# Patient Record
Sex: Female | Born: 1980 | Race: Black or African American | Hispanic: No | Marital: Single | Smoking: Never smoker
Health system: Southern US, Community
[De-identification: ages and names within clinical notes are randomized; demographics above are authoritative.]

## PROBLEM LIST (undated history)

## (undated) DIAGNOSIS — I10 Essential (primary) hypertension: Secondary | ICD-10-CM

## (undated) DIAGNOSIS — E119 Type 2 diabetes mellitus without complications: Secondary | ICD-10-CM

## (undated) HISTORY — PX: BREAST SURGERY: SHX581

---

## 2017-11-28 ENCOUNTER — Emergency Department (HOSPITAL_COMMUNITY): Payer: Medicaid Other

## 2017-11-28 ENCOUNTER — Emergency Department (HOSPITAL_BASED_OUTPATIENT_CLINIC_OR_DEPARTMENT_OTHER): Payer: Medicaid Other

## 2017-11-28 ENCOUNTER — Emergency Department (HOSPITAL_BASED_OUTPATIENT_CLINIC_OR_DEPARTMENT_OTHER)
Admission: EM | Admit: 2017-11-28 | Discharge: 2017-11-29 | Disposition: A | Discharge status: Home / Self Care | Payer: Medicaid Other | Attending: Emergency Medicine | Admitting: Emergency Medicine

## 2017-11-28 ENCOUNTER — Other Ambulatory Visit: Payer: Self-pay

## 2017-11-28 ENCOUNTER — Encounter (HOSPITAL_BASED_OUTPATIENT_CLINIC_OR_DEPARTMENT_OTHER): Payer: Self-pay | Admitting: Emergency Medicine

## 2017-11-28 DIAGNOSIS — Z794 Long term (current) use of insulin: Secondary | ICD-10-CM | POA: Insufficient documentation

## 2017-11-28 DIAGNOSIS — O418X10 Other specified disorders of amniotic fluid and membranes, first trimester, not applicable or unspecified: Secondary | ICD-10-CM

## 2017-11-28 DIAGNOSIS — W19XXXA Unspecified fall, initial encounter: Secondary | ICD-10-CM

## 2017-11-28 DIAGNOSIS — Z3A11 11 weeks gestation of pregnancy: Secondary | ICD-10-CM | POA: Insufficient documentation

## 2017-11-28 DIAGNOSIS — Z79899 Other long term (current) drug therapy: Secondary | ICD-10-CM | POA: Insufficient documentation

## 2017-11-28 DIAGNOSIS — M79672 Pain in left foot: Secondary | ICD-10-CM

## 2017-11-28 DIAGNOSIS — O468X1 Other antepartum hemorrhage, first trimester: Secondary | ICD-10-CM | POA: Insufficient documentation

## 2017-11-28 DIAGNOSIS — IMO0001 Reserved for inherently not codable concepts without codable children: Secondary | ICD-10-CM

## 2017-11-28 HISTORY — DX: Essential (primary) hypertension: I10

## 2017-11-28 HISTORY — DX: Type 2 diabetes mellitus without complications: E11.9

## 2017-11-28 MED ORDER — ACETAMINOPHEN 325 MG PO TABS
650.0000 mg | ORAL_TABLET | Freq: Once | ORAL | Status: AC
  Administered 2017-11-28: 650 mg via ORAL
  Filled 2017-11-28: qty 2

## 2017-11-28 NOTE — ED Notes (Signed)
Patient transported to Ultrasound 

## 2017-11-28 NOTE — ED Triage Notes (Signed)
Pt states she slipped and fell in the kitchen this evening. Denies LOC. Pt c/o low abd pain, back pain and L foot pain. Pt is [redacted] weeks pregnant.

## 2017-11-29 ENCOUNTER — Encounter (HOSPITAL_BASED_OUTPATIENT_CLINIC_OR_DEPARTMENT_OTHER): Payer: Self-pay | Admitting: Emergency Medicine

## 2017-11-29 LAB — URINALYSIS, ROUTINE W REFLEX MICROSCOPIC
Bilirubin Urine: NEGATIVE
GLUCOSE, UA: 100 mg/dL — AB
KETONES UR: NEGATIVE mg/dL
LEUKOCYTES UA: NEGATIVE
NITRITE: NEGATIVE
PROTEIN: 100 mg/dL — AB
Specific Gravity, Urine: 1.015 (ref 1.005–1.030)
pH: 7 (ref 5.0–8.0)

## 2017-11-29 LAB — URINALYSIS, MICROSCOPIC (REFLEX)

## 2017-11-29 NOTE — ED Provider Notes (Signed)
[facility] EMERGENCY DEPARTMENT Provider Note   CSN: [id number] Arrival date & time: 11/28/17  2020     History   Chief Complaint Chief Complaint  Patient presents with  . Fall    HPI Erica Benton is a 37 y.o. female.  The history is provided by the patient.  Fall  This is a new problem. The current episode started 6 to 12 hours ago. The problem occurs constantly. The problem has not changed since onset.Pertinent negatives include no chest pain, no abdominal pain, no headaches and no shortness of breath. The symptoms are aggravated by walking. Nothing relieves the symptoms. She has tried nothing for the symptoms. The treatment provided no relief.  Landed on her bottom and L foot got caught.  No bleeding of leakage of fluid.  Is approximately [redacted] weeks pregnant, RH positive.    Past Medical History:  Diagnosis Date  . Diabetes mellitus without complication (HCC)   . Hypertension     There are no active problems to display for this patient.   Past Surgical History:  Procedure Laterality Date  . BREAST SURGERY    . CESAREAN SECTION      OB History    Gravida Para Term Preterm AB Living   1             SAB TAB Ectopic Multiple Live Births                   Home Medications    Prior to Admission medications   Medication Sig Start Date End Date Taking? Authorizing Provider  diltiazem (CARDIZEM) 100 MG injection Inject 120 mg into the vein daily.   Yes [provider]  insulin aspart (NOVOLOG) 100 UNIT/ML injection Inject into the skin 3 (three) times daily before meals.   Yes [provider]  metFORMIN (GLUCOPHAGE) 500 MG tablet Take by mouth 2 (two) times daily with a meal.   Yes [provider]    Family History No family history on file.  Social History Social History   Tobacco Use  . Smoking status: Never Smoker  . Smokeless tobacco: Never Used  Substance Use Topics  . Alcohol use: Not on file  . Drug use: Not on  file     Allergies   Ace inhibitors and Sulfa antibiotics   Review of Systems Review of Systems  Respiratory: Negative for shortness of breath.   Cardiovascular: Negative for chest pain.  Gastrointestinal: Negative for abdominal pain.  Neurological: Negative for headaches.  All other systems reviewed and are negative.    Physical Exam Updated Vital Signs BP 128/70 (BP Location: Right Arm)   Pulse 81   Temp 99 F (37.2 C) (Oral)   Resp 18   Ht 5\' 9"  (1.753 m)   Wt 117.9 kg (260 lb)   SpO2 100%   BMI 38.40 kg/m   Physical Exam  Constitutional: She is oriented to person, place, and time. She appears well-developed and well-nourished. No distress.  HENT:  Head: Normocephalic and atraumatic.  Right Ear: External ear normal.  Left Ear: External ear normal.  Nose: Nose normal.  Mouth/Throat: Oropharynx is clear and moist. No oropharyngeal exudate.  Eyes: Conjunctivae and EOM are normal. Pupils are equal, round, and reactive to light.  Neck: Normal range of motion. Neck supple.  Cardiovascular: Normal rate, regular rhythm, normal heart sounds and intact distal pulses.  Pulmonary/Chest: Effort normal and breath sounds normal. No stridor. She has no wheezes. She has no  rales.  Abdominal: Soft. Bowel sounds are normal. She exhibits no distension and no mass. There is no rebound and no guarding.  Musculoskeletal: Normal range of motion. She exhibits no tenderness or deformity.       Right hip: Normal.       Left hip: Normal.       Left knee: Normal.       Left ankle: Normal. Achilles tendon normal.       Cervical back: Normal.       Lumbar back: Normal.       Left lower leg: Normal.       Left foot: Normal.  Neurological: She is alert and oriented to person, place, and time. She displays normal reflexes. GCS eye subscore is 4. GCS verbal subscore is 5. GCS motor subscore is 6.  Skin: Skin is warm and dry. Capillary refill takes less than 2 seconds.  Psychiatric: She has a  normal mood and affect.  Nursing note and vitals reviewed.    ED Treatments / Results   Vitals:   11/28/17 2030 11/28/17 2244  BP: (!) 160/79 128/70  Pulse: 86 81  Resp: 18 18  Temp: 99.2 F (37.3 C) 99 F (37.2 C)  SpO2: 100% 100%    Radiology US Ob Comp Less 14 Wks  Result Date: 11/29/2017 CLINICAL DATA:  Initial evaluation for acute trauma, fall. Early pregnancy. EXAM: OBSTETRIC <14 WK [place] AND TRANSVAGINAL OB US TECHNIQUE: Both transabdominal and transvaginal ultrasound examinations were performed for complete evaluation of the gestation as well as the maternal uterus, adnexal regions, and pelvic cul-de-sac. Transvaginal technique was performed to assess early pregnancy. COMPARISON:  None. FINDINGS: Intrauterine gestational sac: Single Yolk sac:  Not visualized. Embryo:  Present Cardiac Activity: Present Heart Rate: 169 bpm CRL: 44.2 mm   11 w   2 d                  [place] EDC: 06/18/2018 Subchorionic hemorrhage: Moderate subchorionic hemorrhage without significant mass effect. Maternal uterus/adnexae: Ovaries are normal in appearance bilaterally. No adnexal mass. No free pelvic fluid. IMPRESSION: 1. Single viable intrauterine pregnancy as above, estimated gestational age [redacted] weeks and 2 days by crown-rump length. 2. Moderate subchorionic hemorrhage without significant mass effect. 3. No other acute maternal uterine or adnexal abnormality identified. Electronically Signed   By: Rise Mu M.D.   On: 11/29/2017 00:27   Dg Foot Complete Left  Result Date: 11/29/2017 CLINICAL DATA:  37 year old female with fall and left foot pain. EXAM: LEFT FOOT - COMPLETE 3+ VIEW COMPARISON:  None. FINDINGS: There is no evidence of fracture or dislocation. There is no evidence of arthropathy or other focal bone abnormality. Soft tissues are unremarkable. IMPRESSION: Negative. Electronically Signed   By: Elgie Collard M.D.   On: 11/29/2017 01:00    Procedures Procedures (including critical care  time)  Medications Ordered in ED Medications  acetaminophen (TYLENOL) tablet 650 mg (650 mg Oral Given 11/28/17 2325)      Final Clinical Impressions(s) / ED Diagnoses   Final diagnoses:  Subchorionic hemorrhage of placenta in first trimester, single or unspecified fetus  Foot pain, left   Tylenol for pain, ice and elevate the foot.  Patient informed both verbally and on her discharge papers of subchorionic hemorrhage.  Patient will need complete pelvic rest (nothing per vagina) until cleared by OB and is instructed to call her OB in the am for follow up this week.  Patient verbalizes understanding of all written  and verbal instructions and agrees to follow up.  Nurse Sophie present.   Return for weakness, numbness, changes in vision or speech, fevers >100.4 unrelieved by medication, shortness of breath, intractable vomiting, or diarrhea, abdominal pain, Inability to tolerate liquids or food, cough, altered mental status or any concerns. No signs of systemic illness or infection. The patient is nontoxic-appearing on exam and vital signs are within normal limits.   I have reviewed the triage vital signs and the nursing notes. Pertinent labs &imaging results that were available during my care of the patient were reviewed by me and considered in my medical decision making (see chart for details).  After history, exam, and medical workup I feel the patient has been appropriately medically screened and is safe for discharge home. Pertinent diagnoses were discussed with the patient. Patient was given return precautions.  ED Discharge Orders    None       Hobson Lax, MD 11/29/17 [phone]

## 2017-11-29 NOTE — Discharge Instructions (Addendum)
Follow up with your OB this week, pelvic rest

## 2018-07-23 DIAGNOSIS — S0083XA Contusion of other part of head, initial encounter: Secondary | ICD-10-CM | POA: Insufficient documentation

## 2018-07-23 DIAGNOSIS — S00511A Abrasion of lip, initial encounter: Secondary | ICD-10-CM | POA: Insufficient documentation

## 2018-07-23 DIAGNOSIS — I10 Essential (primary) hypertension: Secondary | ICD-10-CM | POA: Insufficient documentation

## 2018-07-23 DIAGNOSIS — Y9389 Activity, other specified: Secondary | ICD-10-CM | POA: Insufficient documentation

## 2018-07-23 DIAGNOSIS — Y92009 Unspecified place in unspecified non-institutional (private) residence as the place of occurrence of the external cause: Secondary | ICD-10-CM | POA: Insufficient documentation

## 2018-07-23 DIAGNOSIS — E119 Type 2 diabetes mellitus without complications: Secondary | ICD-10-CM | POA: Insufficient documentation

## 2018-07-23 DIAGNOSIS — Y999 Unspecified external cause status: Secondary | ICD-10-CM | POA: Insufficient documentation

## 2018-07-23 DIAGNOSIS — Z794 Long term (current) use of insulin: Secondary | ICD-10-CM | POA: Insufficient documentation
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
## 2018-07-24 ENCOUNTER — Emergency Department (HOSPITAL_COMMUNITY)
Admission: EM | Admit: 2018-07-24 | Discharge: 2018-07-24 | Disposition: A | Discharge status: Home / Self Care | Payer: Medicaid Other | Attending: Emergency Medicine | Admitting: Emergency Medicine

## 2018-07-24 ENCOUNTER — Encounter: Payer: Self-pay | Admitting: Emergency Medicine

## 2018-07-24 DIAGNOSIS — S0993XA Unspecified injury of face, initial encounter: Secondary | ICD-10-CM

## 2018-07-24 MED ORDER — OXYCODONE-ACETAMINOPHEN 5-325 MG PO TABS
1.0000 | ORAL_TABLET | Freq: Once | ORAL | Status: AC
  Administered 2018-07-24: 1 via ORAL
  Filled 2018-07-24: qty 1
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
## 2018-07-24 NOTE — Discharge Instructions (Signed)
Recommend ice to help with swelling.  Can take tylenol or motrin. Recommend soft diet to help prevent recurrent bleeding. See attached information for safe housing, women's assistance, etc if needed. Return here for any new/acute changes.

## 2018-07-24 NOTE — ED Notes (Signed)
Pt has laceration on inner and outer lower lip. Pt denies having any teeth loose. Bleeding is controlled.

## 2019-07-17 ENCOUNTER — Emergency Department (HOSPITAL_BASED_OUTPATIENT_CLINIC_OR_DEPARTMENT_OTHER)
Admission: EM | Admit: 2019-07-17 | Discharge: 2019-07-17 | Disposition: A | Discharge status: Home / Self Care | Payer: Medicaid Other | Attending: Emergency Medicine | Admitting: Emergency Medicine

## 2019-07-17 ENCOUNTER — Other Ambulatory Visit: Payer: Self-pay

## 2019-07-17 ENCOUNTER — Encounter (HOSPITAL_BASED_OUTPATIENT_CLINIC_OR_DEPARTMENT_OTHER): Payer: Self-pay | Admitting: *Deleted

## 2019-07-17 DIAGNOSIS — Z79899 Other long term (current) drug therapy: Secondary | ICD-10-CM | POA: Insufficient documentation

## 2019-07-17 DIAGNOSIS — I10 Essential (primary) hypertension: Secondary | ICD-10-CM | POA: Insufficient documentation

## 2019-07-17 DIAGNOSIS — Z5189 Encounter for other specified aftercare: Secondary | ICD-10-CM

## 2019-07-17 DIAGNOSIS — E119 Type 2 diabetes mellitus without complications: Secondary | ICD-10-CM | POA: Insufficient documentation

## 2019-07-17 DIAGNOSIS — Z4800 Encounter for change or removal of nonsurgical wound dressing: Secondary | ICD-10-CM | POA: Insufficient documentation

## 2019-07-17 DIAGNOSIS — Z794 Long term (current) use of insulin: Secondary | ICD-10-CM | POA: Insufficient documentation

## 2019-07-17 NOTE — Discharge Instructions (Signed)
Your wound was too old for [place] to do any definitive closure however as it was slightly gaping, we together agreed to put some Steri-Strips just to help approximate it.  Please watch for any signs and symptoms of infection.  Please keep the wound clean and dry.  Please follow-up with your primary doctor.  If any symptoms change or worsen or he starts to see evidence of infection, please return to the nearest emergency department immediately.
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
## 2019-07-17 NOTE — ED Notes (Signed)
Patient verbalizes understanding of discharge instructions. Opportunity for questioning and answers were provided. Armband removed by staff, pt discharged from ED.  

## 2019-07-17 NOTE — ED Triage Notes (Signed)
Laceration above her right eye 2 days ago. The scab came off and she wants sutures. Wound check.

## 2019-08-18 IMAGING — DX DG FOOT COMPLETE 3+V*L*
3 series · 3 of 3 positions shown · non-contrast
Comparison: None.

CLINICAL DATA: 37-year-old female with fall and left foot pain.

EXAM:
LEFT FOOT - COMPLETE 3+ VIEW

[foot ap]
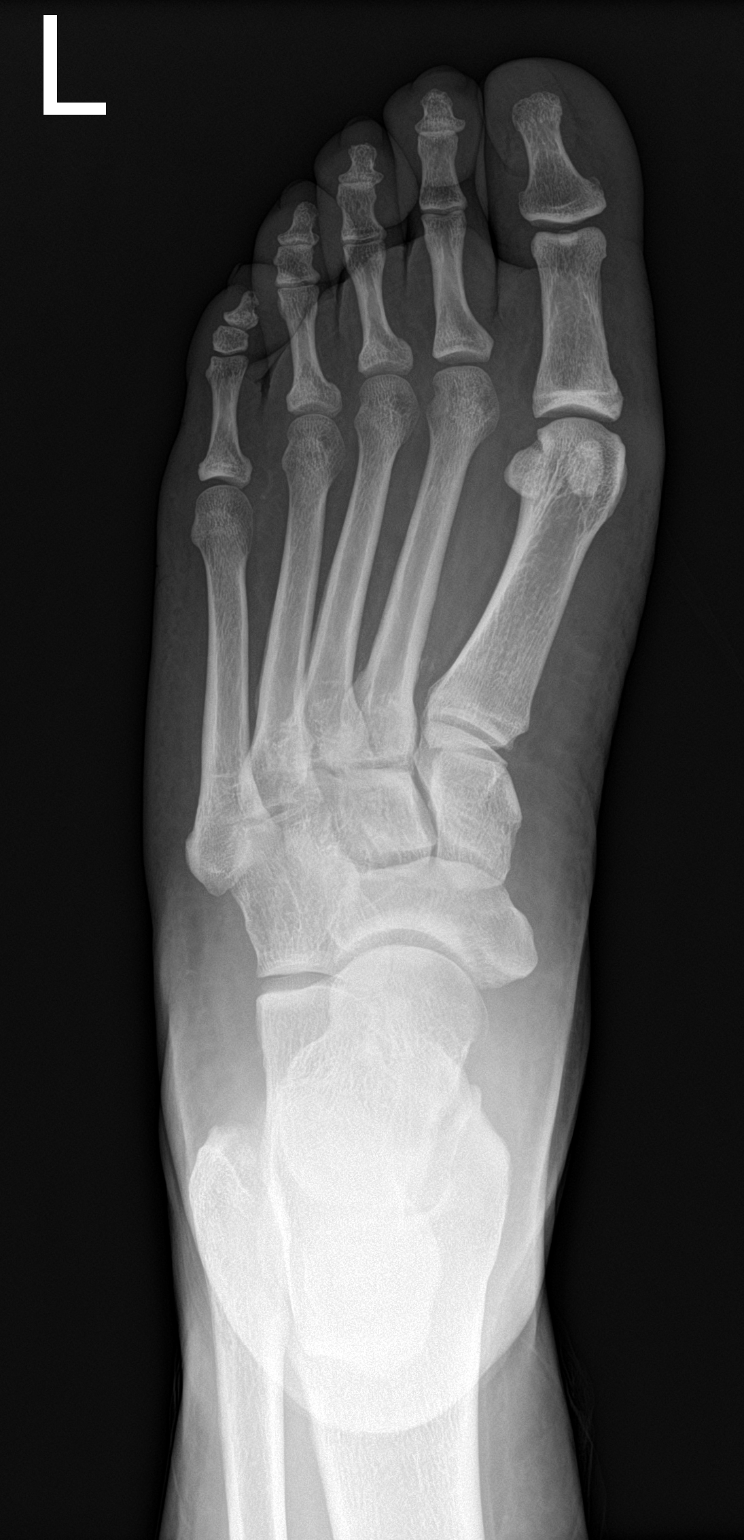

[foot obl]
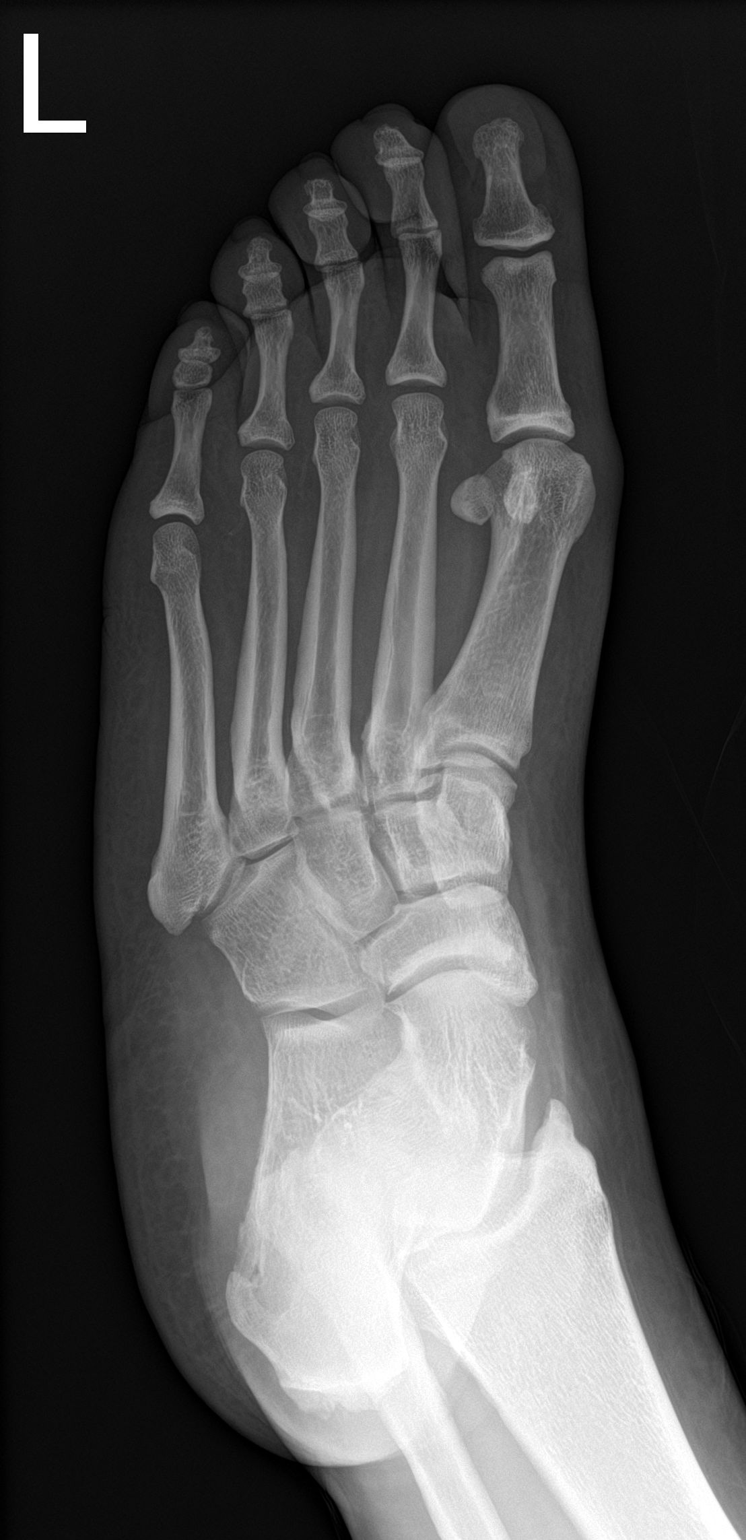

[foot lat]
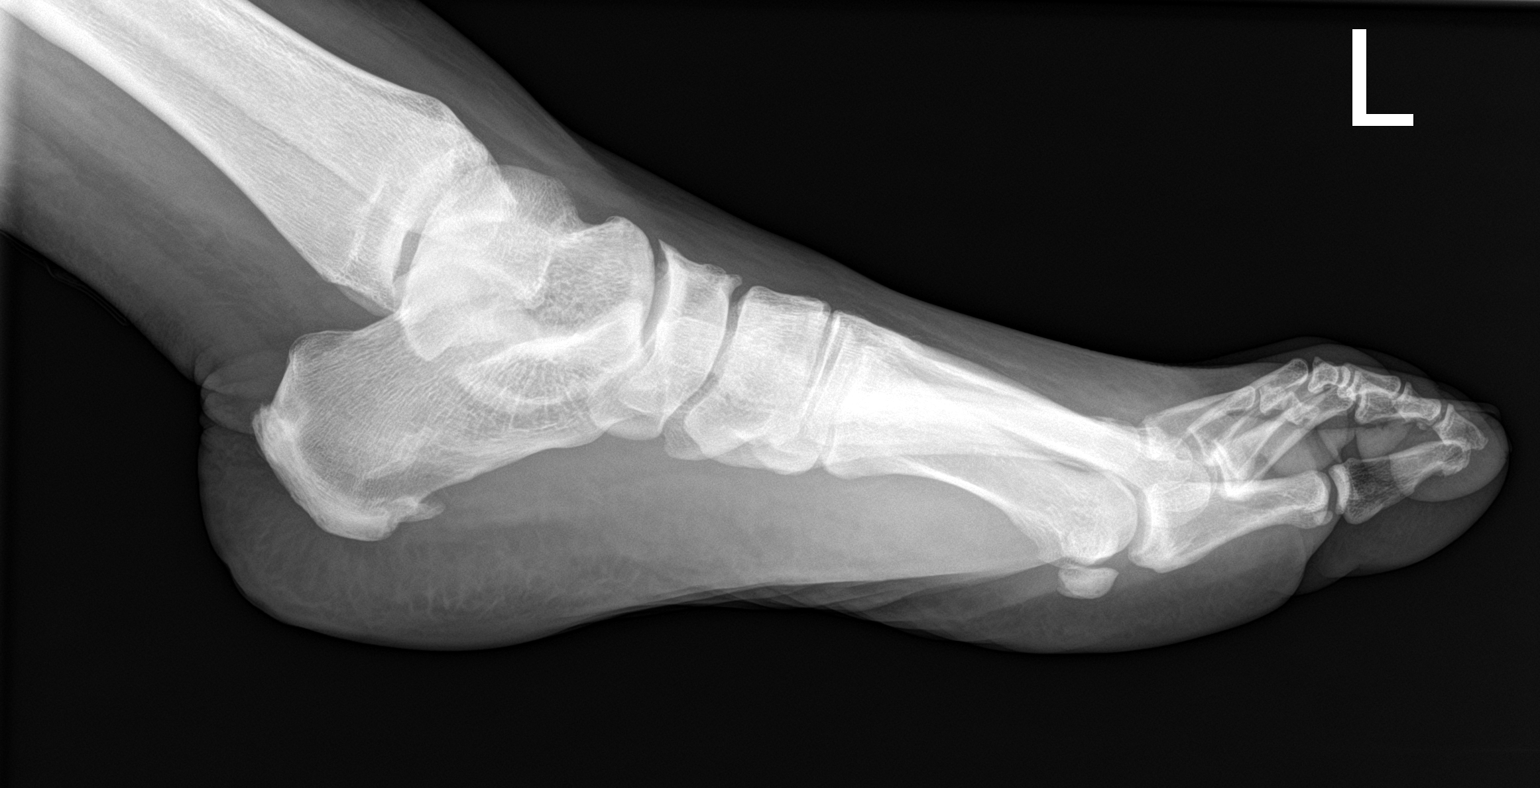

[3 of 3 positions shown; findings below may reference images not displayed]

FINDINGS: There is no evidence of fracture or dislocation. There is no
evidence of arthropathy or other focal bone abnormality. Soft
tissues are unremarkable.
IMPRESSION: Negative.
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
